# Patient Record
Sex: Male | Born: 1990 | Race: Black or African American | Hispanic: No | Marital: Single | Smoking: Never smoker
Health system: Southern US, Community
[De-identification: ages and names within clinical notes are randomized; demographics above are authoritative.]

## PROBLEM LIST (undated history)

## (undated) DIAGNOSIS — I10 Essential (primary) hypertension: Secondary | ICD-10-CM

---

## 2018-06-12 ENCOUNTER — Other Ambulatory Visit: Payer: Self-pay

## 2018-06-12 ENCOUNTER — Emergency Department (HOSPITAL_COMMUNITY)
Admission: EM | Admit: 2018-06-12 | Discharge: 2018-06-12 | Disposition: A | Discharge status: Home / Self Care | Payer: Self-pay | Attending: Emergency Medicine | Admitting: Emergency Medicine

## 2018-06-12 ENCOUNTER — Encounter (HOSPITAL_COMMUNITY): Payer: Self-pay | Admitting: *Deleted

## 2018-06-12 ENCOUNTER — Emergency Department (HOSPITAL_COMMUNITY): Payer: Self-pay

## 2018-06-12 DIAGNOSIS — R0602 Shortness of breath: Secondary | ICD-10-CM | POA: Insufficient documentation

## 2018-06-12 DIAGNOSIS — R079 Chest pain, unspecified: Secondary | ICD-10-CM | POA: Insufficient documentation

## 2018-06-12 DIAGNOSIS — F419 Anxiety disorder, unspecified: Secondary | ICD-10-CM | POA: Insufficient documentation

## 2018-06-12 DIAGNOSIS — I10 Essential (primary) hypertension: Secondary | ICD-10-CM | POA: Insufficient documentation

## 2018-06-12 HISTORY — DX: Essential (primary) hypertension: I10

## 2018-06-12 LAB — CBC
HCT: 43.5 % (ref 39.0–52.0)
Hemoglobin: 14.4 g/dL (ref 13.0–17.0)
MCH: 29.5 pg (ref 26.0–34.0)
MCHC: 33.1 g/dL (ref 30.0–36.0)
MCV: 89.1 fL (ref 80.0–100.0)
Platelets: 256 10*3/uL (ref 150–400)
RBC: 4.88 MIL/uL (ref 4.22–5.81)
RDW: 11.8 % (ref 11.5–15.5)
WBC: 6.4 10*3/uL (ref 4.0–10.5)
nRBC: 0 % (ref 0.0–0.2)

## 2018-06-12 LAB — BASIC METABOLIC PANEL
Anion gap: 8 (ref 5–15)
BUN: 12 mg/dL (ref 6–20)
CO2: 24 mmol/L (ref 22–32)
Calcium: 9.6 mg/dL (ref 8.9–10.3)
Chloride: 107 mmol/L (ref 98–111)
Creatinine, Ser: 1.12 mg/dL (ref 0.61–1.24)
GFR calc Af Amer: >60 mL/min (ref >60)
GFR calc non Af Amer: >60 mL/min (ref >60)
Glucose, Bld: 101 mg/dL — ABNORMAL HIGH (ref 70–99)
Potassium: 3.6 mmol/L (ref 3.5–5.1)
Sodium: 139 mmol/L (ref 135–145)

## 2018-06-12 LAB — D-DIMER, QUANTITATIVE: D-Dimer, Quant: 0.4 ug/mL-FEU (ref 0.00–0.50)

## 2018-06-12 LAB — TROPONIN I: Troponin I: <0.03 ng/mL (ref <0.03)

## 2018-06-12 MED ORDER — SODIUM CHLORIDE 0.9% FLUSH: 3.0000 mL | Freq: Once | INTRAVENOUS | Status: DC

## 2018-06-12 MED ORDER — HYDROXYZINE HCL 25 MG PO TABS: 25.0000 mg | ORAL_TABLET | Freq: Three times a day (TID) | ORAL | 0 refills | Status: AC | PRN

## 2018-06-12 NOTE — ED Triage Notes (Signed)
Pt reports intermittent sob, cp and lightheadedness x 1 week ago.  He went to [facility] ED a week ago for same.  He was placed on Carvedilol and to f/u with Cardiologist but he states he was not given a number or any info.  He reports pain in his chest radiates to the left of his chest towards his L axilla.  He describes the cp as sharp, heavy, and pressure.  He appears comfortable and in NAD.  He is A&O x 4.

## 2018-06-12 NOTE — ED Notes (Signed)
Pt verbalized discharge instructions and follow up care. Alert and ambulatory. No IV.  

## 2018-06-12 NOTE — Discharge Instructions (Addendum)
Please follow-up with a Primary Care Doctor or Cardiologist in your home town.  Your test results were reassuring tonight, and did not show any evidence of life-threatening illness.  You will need to complete further evaluation on an outpatient basis.

## 2018-06-12 NOTE — ED Provider Notes (Signed)
[facility]-EMERGENCY DEPT Provider Note   CSN: [id number] Arrival date & time: 06/12/18  0019    History   Chief Complaint Chief Complaint  Patient presents with  . Shortness of Breath  . Chest Pain    HPI Paul Patterson is a 28 y.o. male.  HPI: A 28 year old patient with a history of hypertension presents for evaluation of chest pain. Initial onset of pain was more than 6 hours ago. The patient's chest pain is sharp and is not worse with exertion. The patient's chest pain is middle- or left-sided, is not well-localized, is not described as heaviness/pressure/tightness and does not radiate to the arms/jaw/neck. The patient does not complain of nausea and denies diaphoresis. The patient has smoked in the past 90 days. The patient has no history of stroke, has no history of peripheral artery disease, denies any history of treated diabetes, has no relevant family history of coronary artery disease (first degree relative at less than age 28), has no history of hypercholesterolemia and does not have an elevated BMI (>=30).   Patient presents to the emergency department with a chief complaint of chest pain and shortness of breath.  He reports having had the same symptoms for the past week.  [place] [facility] [place], and had reassuring work-up, and was advised to follow-up with a cardiologist.  States that he subsequently went to an urgent care, and was started on carvedilol for hypertension.  He has no previously diagnosed history of hypertension.  Denies any history of PE or DVT.  Denies any recent travel, surgery, or immobilization.  He describes his chest pain as sharp, heavy, and a pressure.  He reports associated shortness of breath, and states that the pain radiates to his left arm.  It has been mostly constant for the past week, but he states that he feels improved while sleeping because he does not think about the symptoms.  He states that the  symptoms make him anxious.  The history is provided by the patient. No language interpreter was used.    Past Medical History:  Diagnosis Date  . Hypertension     There are no active problems to display for this patient.   History reviewed. No pertinent surgical history.      Home Medications    Prior to Admission medications   Not on File    Family History No family history on file.  Social History Social History   Tobacco Use  . Smoking status: Never Smoker  . Smokeless tobacco: Never Used  Substance Use Topics  . Alcohol use: Not Currently  . Drug use: Not Currently     Allergies   Patient has no known allergies.   Review of Systems Review of Systems  All other systems reviewed and are negative.    Physical Exam Updated Vital Signs BP (!) 167/98 (BP Location: Left Arm)   Pulse (!) 57   Temp 98.8 F (37.1 C)   Resp 20   SpO2 100%   Physical Exam Vitals signs and nursing note reviewed.  Constitutional:      Appearance: He is well-developed.  HENT:     Head: Normocephalic and atraumatic.  Eyes:     Conjunctiva/sclera: Conjunctivae normal.  Neck:     Musculoskeletal: Neck supple.  Cardiovascular:     Rate and Rhythm: Normal rate and regular rhythm.     Heart sounds: No murmur.  Pulmonary:     Effort: Pulmonary  effort is normal. No respiratory distress.     Breath sounds: Normal breath sounds.     Comments: No chest wall tenderness Lungs are clear to auscultation Abdominal:     Palpations: Abdomen is soft.     Tenderness: There is no abdominal tenderness.  Skin:    General: Skin is warm and dry.  Neurological:     Mental Status: He is alert.      ED Treatments / Results  Labs (all labs ordered are listed, but only abnormal results are displayed) Labs Reviewed  BASIC METABOLIC PANEL  CBC  TROPONIN I  D-DIMER, QUANTITATIVE (NOT AT [facility])    EKG EKG Interpretation  Date/Time:  Saturday June 12 2018 00:33:23 EDT  Ventricular Rate:  54 PR Interval:    QRS Duration: 107 QT Interval:  437 QTC Calculation: 415 R Axis:   77 Text Interpretation:  Sinus rhythm RSR' in V1 or V2, right VCD or RVH Baseline wander in lead(s) II No previous ECGs available Confirmed by Glynn Octave [phone]) on 06/12/2018 12:36:18 AM   Radiology No results found.  Procedures Procedures (including critical care time)  Medications Ordered in ED Medications  sodium chloride flush (NS) 0.9 % injection 3 mL (has no administration in time range)     Initial Impression / Assessment and Plan / ED Course  I have reviewed the triage vital signs and the nursing notes.  Pertinent labs & imaging results that were available during my care of the patient were reviewed by me and considered in my medical decision making (see chart for details).     HEAR Score: 1  Patient with chest pain and shortness of breath x1 week.  States that the symptoms are there more often than not, and are improved when he sleeps.  He does not have any positional changes, and denies any recent illnesses, doubt pericarditis.  Will check labs, EKG, and chest x-ray.  EKG shows no ischemic changes, there are no former EKGs to compare to.  Patient is not tachycardic nor hypoxic, I have a low suspicion for PE, but will check d-dimer.  No infectious symptoms, no cough or fever.  D-dimer is negative, troponin negative.  Patient is low risk for ACS.  I have low suspicion for PE.  Given the length of time that the symptoms have been going on, and patient's reported anxiety, feel that his symptoms could be anxiety driven.  Recommend follow-up with PCP.  He does not like the way that the carvedilol makes him feel, discussed that he can discontinue this until he follows up with his primary care doctor.  Final Clinical Impressions(s) / ED Diagnoses   Final diagnoses:  Chest pain, unspecified type  SOB (shortness of breath)  Anxiety    ED Discharge Orders          Ordered    hydrOXYzine (ATARAX/VISTARIL) 25 MG tablet  Every 8 hours PRN     06/12/18 0200           Roxy Horseman, PA-C 06/12/18 0203    Glynn Octave, MD 06/12/18 [phone]

## 2020-09-29 IMAGING — CR CHEST - 2 VIEW
2 series · 2 of 2 positions shown · non-contrast
Comparison: None.

CLINICAL DATA: Shortness of breath and chest pain

EXAM:
CHEST - 2 VIEW

[w chest pa]
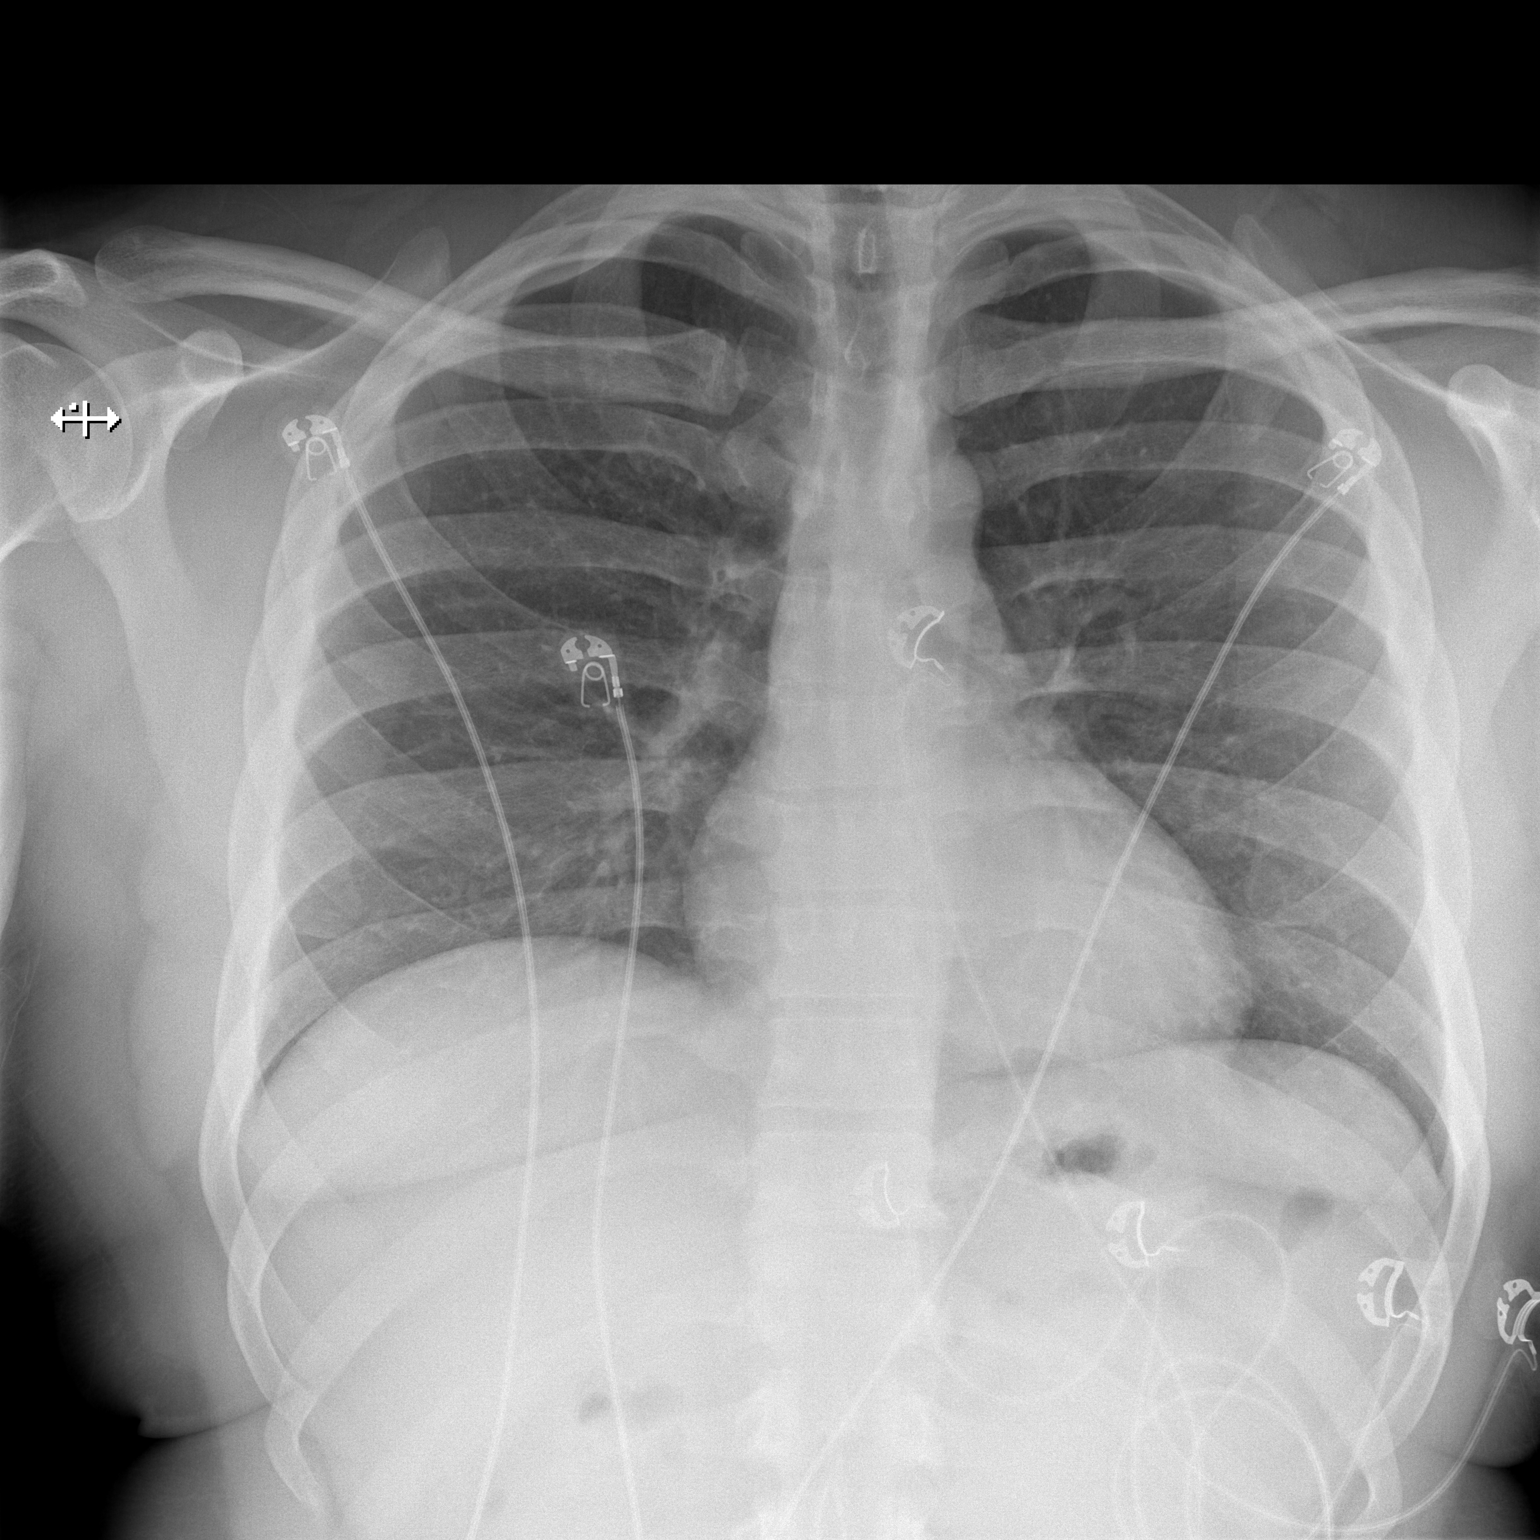

[w chest lat]
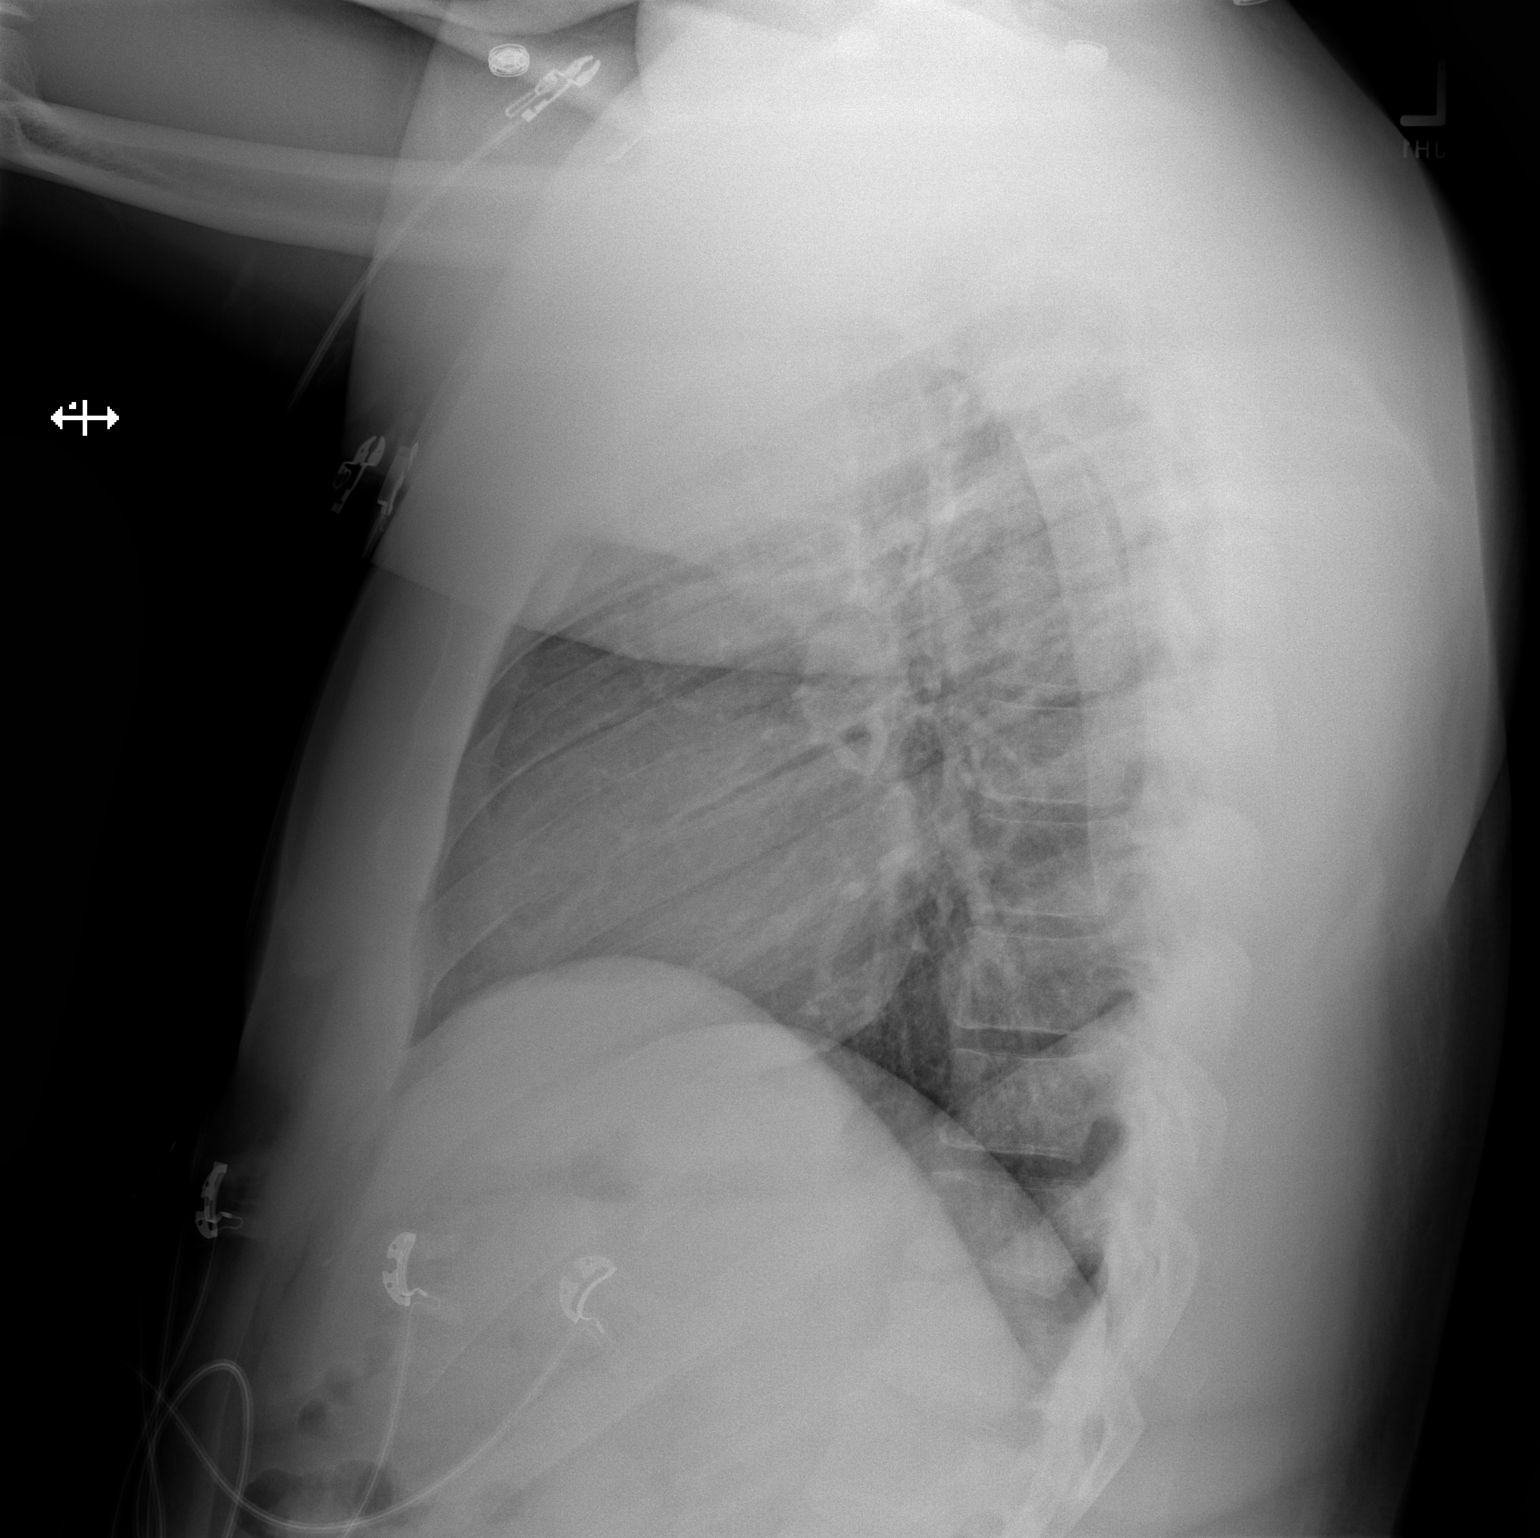

[2 of 2 positions shown; findings below may reference images not displayed]

FINDINGS: The heart size and mediastinal contours are within normal limits.
Both lungs are clear. The visualized skeletal structures are
unremarkable.
IMPRESSION: No active cardiopulmonary disease.
